# Patient Record
Sex: Male | Born: 1980 | Race: White | Hispanic: No | Marital: Single | Smoking: Never smoker
Health system: Southern US, Community
[De-identification: ages and names within clinical notes are randomized; demographics above are authoritative.]

## PROBLEM LIST (undated history)

## (undated) DIAGNOSIS — H53002 Unspecified amblyopia, left eye: Secondary | ICD-10-CM

---

## 2008-12-04 ENCOUNTER — Emergency Department (HOSPITAL_COMMUNITY): Admission: EM | Admit: 2008-12-04 | Discharge: 2008-12-04 | Payer: Self-pay | Admitting: Emergency Medicine

## 2008-12-30 ENCOUNTER — Encounter: Admission: RE | Admit: 2008-12-30 | Discharge: 2008-12-30 | Payer: Self-pay | Admitting: Specialist

## 2009-02-10 ENCOUNTER — Ambulatory Visit (HOSPITAL_BASED_OUTPATIENT_CLINIC_OR_DEPARTMENT_OTHER): Admission: RE | Admit: 2009-02-10 | Discharge: 2009-02-10 | Payer: Self-pay | Admitting: Orthopedic Surgery

## 2009-02-10 HISTORY — PX: WRIST SURGERY: SHX841

## 2010-02-17 ENCOUNTER — Emergency Department (HOSPITAL_COMMUNITY)
Admission: EM | Admit: 2010-02-17 | Discharge: 2010-02-17 | Payer: Self-pay | Source: Home / Self Care | Admitting: Emergency Medicine

## 2010-02-18 LAB — POCT I-STAT, CHEM 8
Chloride: 97 mEq/L (ref 96–112)
Glucose, Bld: 97 mg/dL (ref 70–99)
HCT: 38 % — ABNORMAL LOW (ref 39.0–52.0)
Hemoglobin: 12.9 g/dL — ABNORMAL LOW (ref 13.0–17.0)
Potassium: 3.8 mEq/L (ref 3.5–5.1)
Sodium: 133 mEq/L — ABNORMAL LOW (ref 135–145)

## 2010-04-13 LAB — POCT HEMOGLOBIN-HEMACUE: Hemoglobin: 15 g/dL (ref 13.0–17.0)
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
## 2011-10-06 ENCOUNTER — Ambulatory Visit: Payer: 59 | Admitting: Family Medicine

## 2011-10-06 VITALS — BP 110/60 | HR 58 | Temp 98.0°F | Resp 16 | Ht 73.0 in | Wt 171.8 lb

## 2011-10-06 DIAGNOSIS — M79643 Pain in unspecified hand: Secondary | ICD-10-CM

## 2011-10-06 DIAGNOSIS — M79609 Pain in unspecified limb: Secondary | ICD-10-CM
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
## 2011-11-08 ENCOUNTER — Encounter (HOSPITAL_BASED_OUTPATIENT_CLINIC_OR_DEPARTMENT_OTHER): Payer: Self-pay | Admitting: *Deleted

## 2011-11-08 ENCOUNTER — Other Ambulatory Visit: Payer: Self-pay | Admitting: Orthopedic Surgery

## 2011-11-09 ENCOUNTER — Encounter (HOSPITAL_BASED_OUTPATIENT_CLINIC_OR_DEPARTMENT_OTHER): Payer: Self-pay | Admitting: Orthopedic Surgery

## 2011-11-09 ENCOUNTER — Encounter (HOSPITAL_BASED_OUTPATIENT_CLINIC_OR_DEPARTMENT_OTHER): Payer: Self-pay | Admitting: Anesthesiology

## 2011-11-09 ENCOUNTER — Encounter (HOSPITAL_BASED_OUTPATIENT_CLINIC_OR_DEPARTMENT_OTHER)
Admission: RE | Disposition: A | Discharge status: Home / Self Care | Payer: Self-pay | Source: Ambulatory Visit | Attending: Orthopedic Surgery

## 2011-11-09 ENCOUNTER — Ambulatory Visit (HOSPITAL_BASED_OUTPATIENT_CLINIC_OR_DEPARTMENT_OTHER)
Admission: RE | Admit: 2011-11-09 | Discharge: 2011-11-09 | Disposition: A | Discharge status: Home / Self Care | Payer: 59 | Source: Ambulatory Visit | Attending: Orthopedic Surgery | Admitting: Orthopedic Surgery

## 2011-11-09 ENCOUNTER — Ambulatory Visit (HOSPITAL_BASED_OUTPATIENT_CLINIC_OR_DEPARTMENT_OTHER): Payer: 59 | Admitting: Anesthesiology

## 2011-11-09 ENCOUNTER — Encounter (HOSPITAL_BASED_OUTPATIENT_CLINIC_OR_DEPARTMENT_OTHER): Payer: Self-pay

## 2011-11-09 DIAGNOSIS — M729 Fibroblastic disorder, unspecified: Secondary | ICD-10-CM | POA: Insufficient documentation

## 2011-11-09 HISTORY — PX: MASS EXCISION: SHX2000

## 2011-11-09 LAB — POCT HEMOGLOBIN-HEMACUE: Hemoglobin: 13.5 g/dL (ref 13.0–17.0)

## 2011-11-09 SURGERY — EXCISION MASS
Anesthesia: General | Site: Hand | Laterality: Left | Wound class: Clean

## 2011-11-09 MED ORDER — FENTANYL CITRATE 0.05 MG/ML IJ SOLN
25.0000 ug | INTRAMUSCULAR | Status: DC | PRN
  Administered 2011-11-09: 50 ug via INTRAVENOUS

## 2011-11-09 MED ORDER — DEXAMETHASONE SODIUM PHOSPHATE 4 MG/ML IJ SOLN
INTRAMUSCULAR | Status: DC | PRN
  Administered 2011-11-09: 10 mg via INTRAVENOUS

## 2011-11-09 MED ORDER — VANCOMYCIN HCL IN DEXTROSE 1-5 GM/200ML-% IV SOLN
1000.0000 mg | INTRAVENOUS | Status: AC
  Administered 2011-11-09: 1000 mg via INTRAVENOUS

## 2011-11-09 MED ORDER — FENTANYL CITRATE 0.05 MG/ML IJ SOLN
INTRAMUSCULAR | Status: DC | PRN
  Administered 2011-11-09 (×2): 50 ug via INTRAVENOUS

## 2011-11-09 MED ORDER — OXYCODONE HCL 5 MG/5ML PO SOLN: 5.0000 mg | Freq: Once | ORAL | Status: DC | PRN

## 2011-11-09 MED ORDER — CHLORHEXIDINE GLUCONATE 4 % EX LIQD: 60.0000 mL | Freq: Once | CUTANEOUS | Status: DC

## 2011-11-09 MED ORDER — PROPOFOL 10 MG/ML IV BOLUS
INTRAVENOUS | Status: DC | PRN
  Administered 2011-11-09: 200 mg via INTRAVENOUS

## 2011-11-09 MED ORDER — MIDAZOLAM HCL 5 MG/5ML IJ SOLN
INTRAMUSCULAR | Status: DC | PRN
  Administered 2011-11-09: 2 mg via INTRAVENOUS

## 2011-11-09 MED ORDER — ONDANSETRON HCL 4 MG/2ML IJ SOLN
INTRAMUSCULAR | Status: DC | PRN
  Administered 2011-11-09: 4 mg via INTRAVENOUS

## 2011-11-09 MED ORDER — BUPIVACAINE HCL (PF) 0.25 % IJ SOLN
INTRAMUSCULAR | Status: DC | PRN
  Administered 2011-11-09: 6 mL

## 2011-11-09 MED ORDER — LACTATED RINGERS IV SOLN
INTRAVENOUS | Status: DC
  Administered 2011-11-09: 12:00:00 via INTRAVENOUS
  Administered 2011-11-09: 10 mL/h via INTRAVENOUS

## 2011-11-09 MED ORDER — OXYCODONE HCL 5 MG PO TABS: 5.0000 mg | ORAL_TABLET | Freq: Once | ORAL | Status: DC | PRN

## 2011-11-09 MED ORDER — ONDANSETRON HCL 4 MG/2ML IJ SOLN: 4.0000 mg | Freq: Four times a day (QID) | INTRAMUSCULAR | Status: DC | PRN

## 2011-11-09 MED ORDER — HYDROCODONE-ACETAMINOPHEN 5-500 MG PO TABS: 1.0000 | ORAL_TABLET | ORAL | Status: AC | PRN

## 2011-11-09 MED ORDER — LIDOCAINE HCL (CARDIAC) 20 MG/ML IV SOLN
INTRAVENOUS | Status: DC | PRN
  Administered 2011-11-09: 50 mg via INTRAVENOUS

## 2011-11-09 SURGICAL SUPPLY — 51 items
BANDAGE COBAN STERILE 2 (GAUZE/BANDAGES/DRESSINGS) IMPLANT
BANDAGE GAUZE ELAST BULKY 4 IN (GAUZE/BANDAGES/DRESSINGS) ×2 IMPLANT
BLADE MINI RND TIP GREEN BEAV (BLADE) IMPLANT
BLADE SURG 15 STRL LF DISP TIS (BLADE) ×1 IMPLANT
BLADE SURG 15 STRL SS (BLADE) ×1
BNDG COHESIVE 1X5 TAN STRL LF (GAUZE/BANDAGES/DRESSINGS) IMPLANT
BNDG COHESIVE 3X5 TAN STRL LF (GAUZE/BANDAGES/DRESSINGS) ×2 IMPLANT
BNDG ESMARK 4X9 LF (GAUZE/BANDAGES/DRESSINGS) ×2 IMPLANT
CHLORAPREP W/TINT 26ML (MISCELLANEOUS) ×2 IMPLANT
CLOTH BEACON ORANGE TIMEOUT ST (SAFETY) ×2 IMPLANT
CORDS BIPOLAR (ELECTRODE) ×2 IMPLANT
COVER MAYO STAND STRL (DRAPES) ×2 IMPLANT
COVER TABLE BACK 60X90 (DRAPES) ×2 IMPLANT
CUFF TOURNIQUET SINGLE 18IN (TOURNIQUET CUFF) IMPLANT
DECANTER SPIKE VIAL GLASS SM (MISCELLANEOUS) IMPLANT
DRAIN PENROSE 1/2X12 LTX STRL (WOUND CARE) IMPLANT
DRAPE EXTREMITY T 121X128X90 (DRAPE) ×2 IMPLANT
DRAPE SURG 17X23 STRL (DRAPES) ×2 IMPLANT
GAUZE XEROFORM 1X8 LF (GAUZE/BANDAGES/DRESSINGS) ×2 IMPLANT
GLOVE BIO SURGEON STRL SZ 6.5 (GLOVE) ×2 IMPLANT
GLOVE BIO SURGEON STRL SZ7.5 (GLOVE) ×4 IMPLANT
GLOVE BIOGEL PI IND STRL 8 (GLOVE) ×2 IMPLANT
GLOVE BIOGEL PI IND STRL 8.5 (GLOVE) ×1 IMPLANT
GLOVE BIOGEL PI INDICATOR 8 (GLOVE) ×2
GLOVE BIOGEL PI INDICATOR 8.5 (GLOVE) ×1
GLOVE SURG ORTHO 8.0 STRL STRW (GLOVE) ×2 IMPLANT
GOWN BRE IMP PREV XXLGXLNG (GOWN DISPOSABLE) ×6 IMPLANT
GOWN PREVENTION PLUS XLARGE (GOWN DISPOSABLE) IMPLANT
NDL SAFETY ECLIPSE 18X1.5 (NEEDLE) ×1 IMPLANT
NEEDLE 27GAX1X1/2 (NEEDLE) IMPLANT
NEEDLE HYPO 18GX1.5 SHARP (NEEDLE) ×1
NS IRRIG 1000ML POUR BTL (IV SOLUTION) ×2 IMPLANT
PACK BASIN DAY SURGERY FS (CUSTOM PROCEDURE TRAY) ×2 IMPLANT
PAD CAST 3X4 CTTN HI CHSV (CAST SUPPLIES) IMPLANT
PADDING CAST ABS 3INX4YD NS (CAST SUPPLIES)
PADDING CAST ABS 4INX4YD NS (CAST SUPPLIES) ×1
PADDING CAST ABS COTTON 3X4 (CAST SUPPLIES) IMPLANT
PADDING CAST ABS COTTON 4X4 ST (CAST SUPPLIES) ×1 IMPLANT
PADDING CAST COTTON 3X4 STRL (CAST SUPPLIES)
SPLINT PLASTER CAST XFAST 3X15 (CAST SUPPLIES) IMPLANT
SPLINT PLASTER XTRA FASTSET 3X (CAST SUPPLIES)
SPONGE GAUZE 4X4 12PLY (GAUZE/BANDAGES/DRESSINGS) ×2 IMPLANT
STOCKINETTE 4X48 STRL (DRAPES) ×2 IMPLANT
SUT VIC AB 4-0 P2 18 (SUTURE) IMPLANT
SUT VICRYL RAPID 5 0 P 3 (SUTURE) IMPLANT
SUT VICRYL RAPIDE 4/0 PS 2 (SUTURE) ×2 IMPLANT
SYR BULB 3OZ (MISCELLANEOUS) ×2 IMPLANT
SYR CONTROL 10ML LL (SYRINGE) IMPLANT
TOWEL OR 17X24 6PK STRL BLUE (TOWEL DISPOSABLE) ×4 IMPLANT
UNDERPAD 30X30 INCONTINENT (UNDERPADS AND DIAPERS) ×2 IMPLANT
WATER STERILE IRR 1000ML POUR (IV SOLUTION) ×2 IMPLANT

## 2011-11-09 NOTE — Anesthesia Procedure Notes (Signed)
Procedure Name: LMA Insertion Date/Time: 11/09/2011 12:25 PM Performed by: Gar Gibbon Pre-anesthesia Checklist: Patient identified, Emergency Drugs available, Suction available and Patient being monitored Patient Re-evaluated:Patient Re-evaluated prior to inductionOxygen Delivery Method: Circle System Utilized Preoxygenation: Pre-oxygenation with 100% oxygen Intubation Type: IV induction Ventilation: Mask ventilation without difficulty LMA: LMA inserted LMA Size: 5.0 Number of attempts: 1 Airway Equipment and Method: bite block Placement Confirmation: positive ETCO2 Tube secured with: Tape Dental Injury: Teeth and Oropharynx as per pre-operative assessment

## 2011-11-09 NOTE — H&P (Signed)
  Brian Wong is 31 yo RH dominate male with a mass on his left palm. He has no history of trauma.  MRI reveals a venous mass.He states it gets bigger and smaller. It is tender to palpation.  He desires removal. Allergies: Pen Medications: none Family History: High blood pressure, arthritis Social History: Smoke: neg  ETOH: social ROS: Mass, otherwise Neg Brian Wong is an 31 y.o. male.   Chief Complaint: mass left palm HPI: see above  History reviewed. No pertinent past medical history.  Past Surgical History  Procedure Date  . Wrist surgery 02/10/2009    exc. of hamate hook right wrist    History reviewed. No pertinent family history. Social History:  reports that he has never smoked. He does not have any smokeless tobacco history on file. His alcohol and drug histories not on file.  Allergies:  Allergies  Allergen Reactions  . Penicillins     No prescriptions prior to admission    No results found for this or any previous visit (from the past 48 hour(s)).  No results found.   Pertinent items are noted in HPI.  Blood pressure 114/69, pulse 56, temperature 98.2 F (36.8 C), temperature source Oral, resp. rate 20, height 6\' 2"  (1.88 m), weight 77.111 kg (170 lb), SpO2 99.00%.  General appearance: alert, cooperative and appears stated age Head: Normocephalic, without obvious abnormality Neck: no adenopathy Resp: clear to auscultation bilaterally Cardio: regular rate and rhythm, S1, S2 normal, no murmur, click, rub or gallop GI: soft, non-tender; bowel sounds normal; no masses,  no organomegaly Extremities: mass left palm Pulses: 2+ and symmetric Skin: Skin color, texture, turgor normal. No rashes or lesions Neurologic: Grossly normal Incision/Wound: na  Assessment/Plan Mass left palm. 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

## 2011-11-09 NOTE — Op Note (Signed)
Dictation number: [id number]

## 2011-11-09 NOTE — Anesthesia Preprocedure Evaluation (Signed)
Anesthesia Evaluation  Patient identified by MRN, date of birth, ID band Patient awake    Reviewed: Allergy & Precautions, H&P , NPO status , Patient's Chart, lab work & pertinent test results  Airway Mallampati: II  Neck ROM: full    Dental   Pulmonary          Cardiovascular     Neuro/Psych    GI/Hepatic   Endo/Other    Renal/GU      Musculoskeletal   Abdominal   Peds  Hematology   Anesthesia Other Findings   Reproductive/Obstetrics                          Anesthesia Physical Anesthesia Plan  ASA: I  Anesthesia Plan: General   Post-op Pain Management:    Induction: Intravenous  Airway Management Planned: LMA  Additional Equipment:   Intra-op Plan:   Post-operative Plan:   Informed Consent: I have reviewed the patients History and Physical, chart, labs and discussed the procedure including the risks, benefits and alternatives for the proposed anesthesia with the patient or authorized representative who has indicated his/her understanding and acceptance.     Plan Discussed with: CRNA and Surgeon  Anesthesia Plan Comments:        Anesthesia Quick Evaluation  

## 2011-11-09 NOTE — Transfer of Care (Signed)
Immediate Anesthesia Transfer of Care Note  Patient: Brian Wong  Procedure(s) Performed: Procedure(s) (LRB) with comments: EXCISION MASS (Left) - Excision Mass Left Palm  Patient Location: PACU  Anesthesia Type: General  Level of Consciousness: sedated  Airway & Oxygen Therapy: Patient Spontanous Breathing and Patient connected to face mask oxygen  Post-op Assessment: Report given to PACU RN and Post -op Vital signs reviewed and stable  Post vital signs: Reviewed and stable  Complications: No apparent anesthesia complications

## 2011-11-09 NOTE — Brief Op Note (Signed)
11/09/2011  12:46 PM  PATIENT:  Brian Wong  31 y.o. male  PRE-OPERATIVE DIAGNOSIS:  Mass Left Palm  POST-OPERATIVE DIAGNOSIS:  Mass Left Palm  PROCEDURE:  Procedure(s) (LRB) with comments: EXCISION MASS (Left) - Excision Mass Left Palm  SURGEON:  Surgeon(s) and Role:    * Nicki Reaper, MD - Primary    * Tami Ribas, MD - Assisting  PHYSICIAN ASSISTANT:   ASSISTANTS: Karlyn Agee, MD   ANESTHESIA:   local and general  EBL:     BLOOD ADMINISTERED:none  DRAINS: none   LOCAL MEDICATIONS USED:  MARCAINE     SPECIMEN:  Excision  DISPOSITION OF SPECIMEN:  PATHOLOGY  COUNTS:  YES  TOURNIQUET:   Total Tourniquet Time Documented: Forearm (Left) - 12 minutes  DICTATION: .Other Dictation: Dictation Number [id number]  PLAN OF CARE: Discharge to home after PACU  PATIENT DISPOSITION:  PACU - hemodynamically stable.

## 2011-11-09 NOTE — Anesthesia Postprocedure Evaluation (Signed)
Anesthesia Post Note  Patient: Brian Wong  Procedure(s) Performed: Procedure(s) (LRB): EXCISION MASS (Left)  Anesthesia type: General  Patient location: PACU  Post pain: Pain level controlled and Adequate analgesia  Post assessment: Post-op Vital signs reviewed, Patient's Cardiovascular Status Stable, Respiratory Function Stable, Patent Airway and Pain level controlled  Last Vitals:  Filed Vitals:   11/09/11 1330  BP: 116/71  Pulse: 57  Temp:   Resp: 24    Post vital signs: Reviewed and stable  Level of consciousness: awake, alert  and oriented  Complications: No apparent anesthesia complications

## 2011-11-10 NOTE — Op Note (Signed)
NAMELORENZO, ARSCOTT              ACCOUNT NO.:  [id number]  MEDICAL RECORD NO.:  [id number]  LOCATION:                                 FACILITY:  PHYSICIAN:  Cindee Salt, M.D.            DATE OF BIRTH:  DATE OF PROCEDURE:  11/09/2011 DATE OF DISCHARGE:                              OPERATIVE REPORT   PREOPERATIVE DIAGNOSIS:  Mass, left palm.  POSTOPERATIVE DIAGNOSIS:  Mass, left palm.  OPERATION:  Excisional biopsy of mass, left palm.  SURGEON:  Cindee Salt, M.D.  ASSISTANT:  Betha Loa, MD  ANESTHESIA:  General with local infiltration.  ANESTHESIOLOGIST:  Achille Rich, MD.  HISTORY:  The patient is a 31 year old male with a history of a mass on the palm left hand at the metacarpophalangeal joint crease of the thenar eminence of his left little finger.  He is desirous of having this removed.  MRI reveals no discrete mass, but area of vascular proliferation.  He is complaining of discomfort.  He is aware of risks and complications including infection, recurrence of injury to arteries, nerves, tendons, incomplete relief of symptoms, dystrophy.  In the preoperative area, the patient is seen, the extremity marked by both the patient and surgeon.  Antibiotic was given.  PROCEDURE:  The patient was brought to the operating room where a general anesthetic was carried out without difficulty.  He was prepped using ChloraPrep, supine position, left arm free.  A 3-minute dry time was allowed.  Time-out taken, confirming the patient and procedure.  A general anesthetic was carried out.  The limb was exsanguinated with an Esmarch bandage.  Tourniquet placed high on the arm was inflated to 250 mmHg.  A longitudinal incision was made directly over the mass, carried down through subcutaneous tissue.  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  With blunt and sharp dissection, this was dissected free along with a portion of the palmar fascia proximally and distally.   The specimen was sent to Pathology.  No further lesions were identified. Neurovascular bundles were intact deep to the area of the mass.  A local infiltration with 0.25% Marcaine was given after closure of the wound with 4-0 Vicryl Rapide sutures and after adequate irrigation, a sterile compressive dressing was applied.  On deflation of the tourniquet, all fingers immediately pinked.  He was taken to the recovery room for observation in a satisfactory condition.  He will be discharged home to return to the [facility] in 1 week on Vicodin.          ______________________________ Cindee Salt, M.D.     GK/MEDQ  D:  11/09/2011  T:  11/10/2011  Job:  [id number]

## 2011-11-11 ENCOUNTER — Encounter (HOSPITAL_BASED_OUTPATIENT_CLINIC_OR_DEPARTMENT_OTHER): Payer: Self-pay | Admitting: Orthopedic Surgery

## 2012-06-25 IMAGING — CR DG CHEST 2V
2 series · 2 of 2 positions shown · non-contrast
Comparison: None

CLINICAL DATA: Dizziness, cough.Fever.

CHEST - 2 VIEW

[w chest pa]
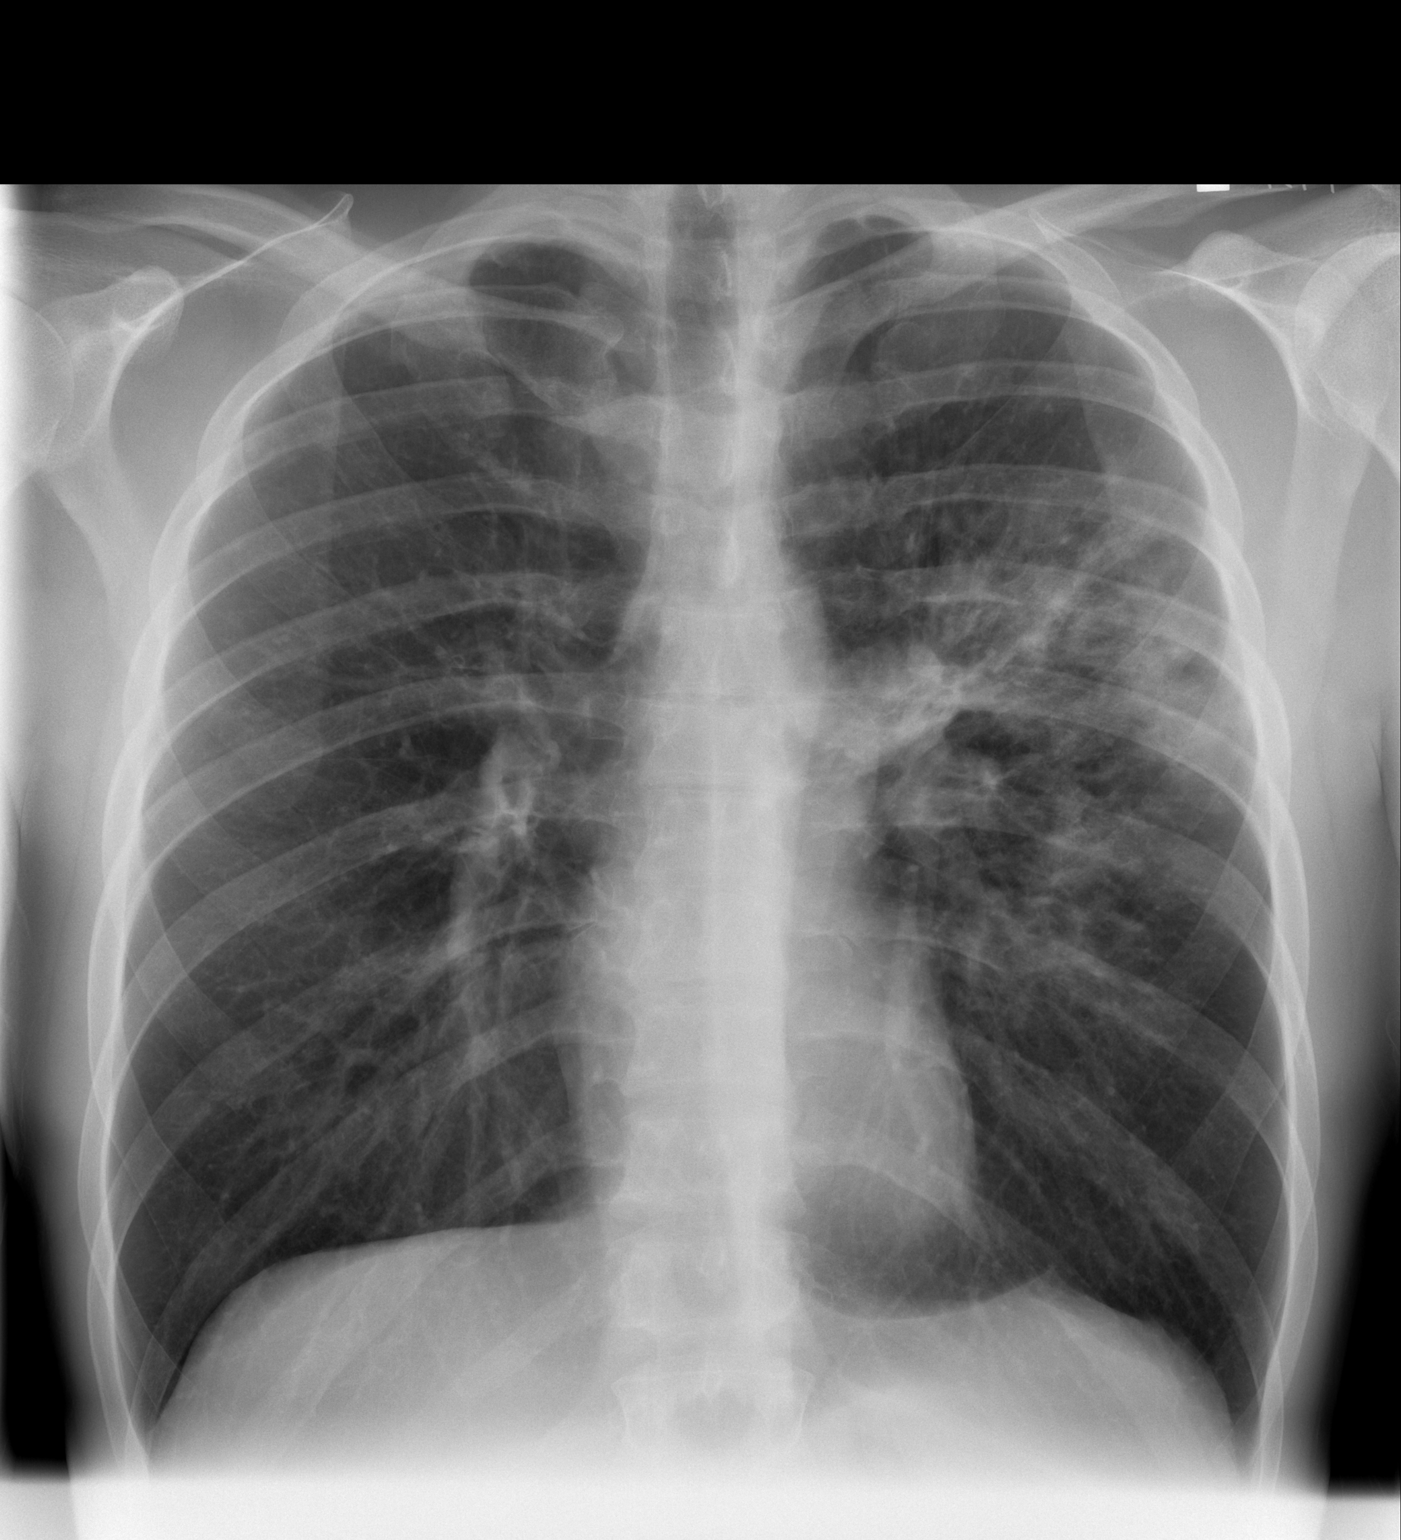

[w chest lat]
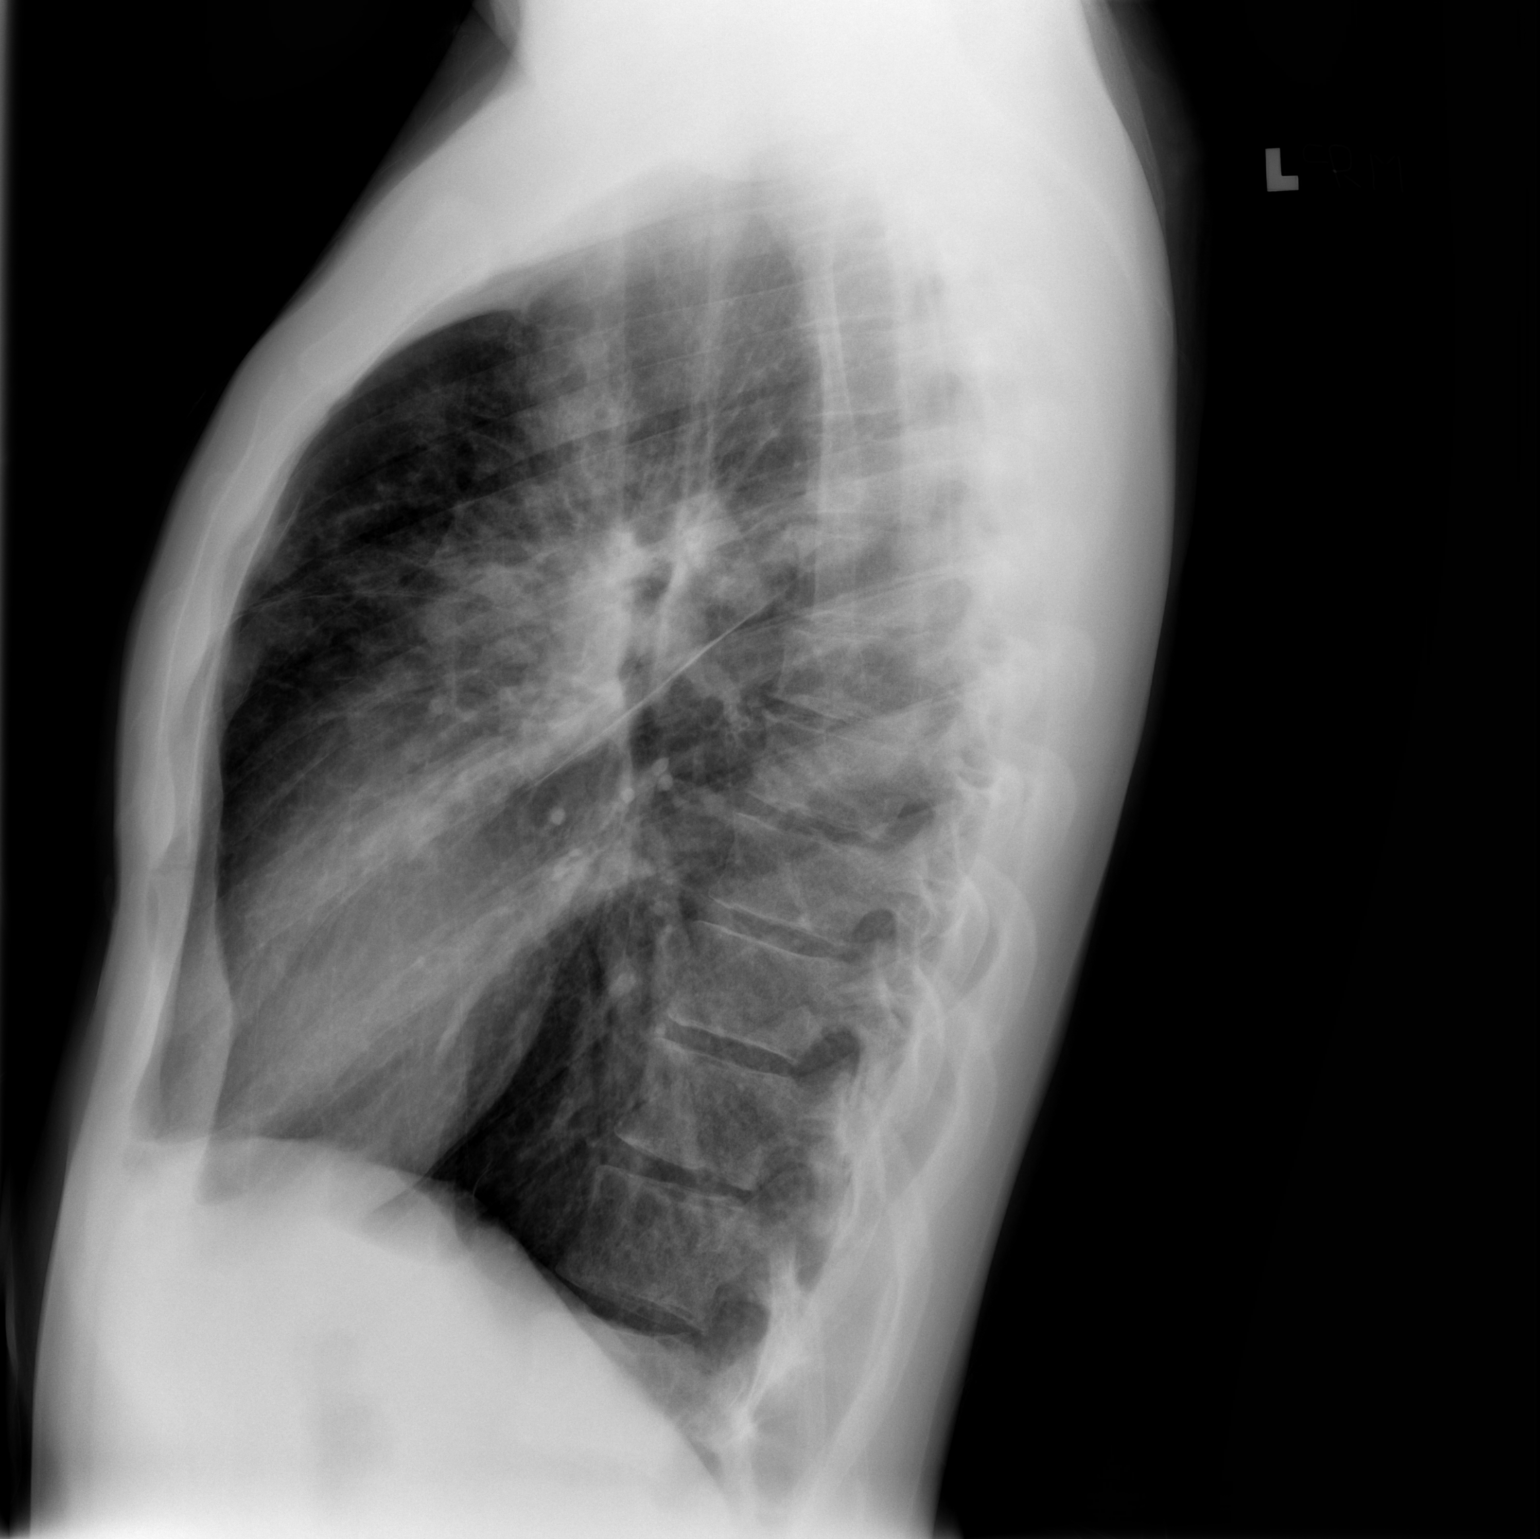

[2 of 2 positions shown; findings below may reference images not displayed]

FINDINGS: Consolidation is noted in the left upper lobe compatible
with pneumonia.  No confluent opacity on the right.  Heart is
normal size.  No effusions or acute bony abnormality.
IMPRESSION: Left upper lobe pneumonia.

## 2016-04-25 ENCOUNTER — Ambulatory Visit
Admission: EM | Admit: 2016-04-25 | Discharge: 2016-04-25 | Disposition: A | Discharge status: Home / Self Care | Payer: BLUE CROSS/BLUE SHIELD | Attending: Family Medicine | Admitting: Family Medicine

## 2016-04-25 ENCOUNTER — Encounter: Payer: Self-pay | Admitting: Gynecology

## 2016-04-25 DIAGNOSIS — H10021 Other mucopurulent conjunctivitis, right eye: Secondary | ICD-10-CM

## 2016-04-25 DIAGNOSIS — L03213 Periorbital cellulitis: Secondary | ICD-10-CM | POA: Diagnosis not present

## 2016-04-25 HISTORY — DX: Unspecified amblyopia, left eye: H53.002

## 2016-04-25 MED ORDER — SULFAMETHOXAZOLE-TRIMETHOPRIM 800-160 MG PO TABS: 1.0000 | ORAL_TABLET | Freq: Two times a day (BID) | ORAL | 0 refills | Status: DC

## 2016-04-25 MED ORDER — MOXIFLOXACIN HCL 0.5 % OP SOLN: 1.0000 [drp] | Freq: Three times a day (TID) | OPHTHALMIC | 0 refills | Status: DC

## 2016-04-25 NOTE — ED Provider Notes (Signed)
[facility]    CSN: [id number] Arrival date & time: 04/25/16  1440     History   Chief Complaint Chief Complaint  Patient presents with  . Eye Problem    HPI Brian Wong is a 36 y.o. male.   The history is provided by the patient.  Eye Problem  Location:  Right eye Quality:  Aching (eyelid swelling, draining pus, and eye redness for 3 days) Severity:  Moderate Onset quality:  Sudden Duration:  3 days Timing:  Constant Progression:  Worsening Chronicity:  New Context: not burn, not chemical exposure, not contact lens problem, not direct trauma, not foreign body, not using machinery, not scratch, not smoke exposure and not UV exposure   Relieved by:  None tried Worsened by:  Nothing Associated symptoms: discharge, itching, redness and swelling   Associated symptoms: no blurred vision, no crusting, no decreased vision, no double vision, no facial rash, no inflammation, no nausea, no numbness, no photophobia, no scotomas, no tearing, no tingling and no vomiting   Risk factors: no conjunctival hemorrhage, no exposure to pinkeye, no previous injury to eye, no recent herpes zoster and no recent URI     Past Medical History:  Diagnosis Date  . 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   Past Surgical History:  Procedure Laterality Date  . MASS EXCISION  11/09/2011   Procedure: EXCISION MASS;  Surgeon: Nicki Reaper, MD;  Location: [facility];  Service: Orthopedics;  Laterality: Left;  Excision Mass Left Palm  . WRIST SURGERY  02/10/2009   exc. of hamate hook right wrist       Home Medications    Prior to Admission medications   Medication Sig Start Date End Date Taking? Authorizing Provider  moxifloxacin (VIGAMOX) 0.5 % ophthalmic solution Place 1 drop into the right eye 3 (three) times daily. 04/25/16   Payton Mccallum, MD  sulfamethoxazole-trimethoprim (BACTRIM DS,SEPTRA DS) 800-160 MG tablet  Take 1 tablet by mouth 2 (two) times daily. 04/25/16   Payton Mccallum, MD    Family History No family history on file.  Social History Social History  Substance Use Topics  . Smoking status: Never Smoker  . Smokeless tobacco: Never Used  . Alcohol use No     Allergies   Penicillins   Review of Systems Review of Systems  Eyes: Positive for discharge, redness and itching. Negative for blurred vision, double vision and photophobia.  Gastrointestinal: Negative for nausea and vomiting.  Neurological: Negative for tingling and numbness.     Physical Exam Triage Vital Signs ED Triage Vitals  Enc Vitals Group     BP 04/25/16 1450 111/73     Pulse Rate 04/25/16 1450 67     Resp 04/25/16 1450 16     Temp 04/25/16 1450 98.2 F (36.8 C)     Temp Source 04/25/16 1450 Oral     SpO2 04/25/16 1450 98 %     Weight 04/25/16 1448 215 lb (97.5 kg)     Height 04/25/16 1448 6' (1.829 m)     Head Circumference --      Peak Flow --      Pain Score 04/25/16 1449 4     Pain Loc --      Pain Edu? --      Excl. in GC? --    No data found.   Updated Vital Signs BP 111/73 (BP Location:  Left Arm)   Pulse 67   Temp 98.2 F (36.8 C) (Oral)   Resp 16   Ht 6' (1.829 m)   Wt 215 lb (97.5 kg)   SpO2 98%   BMI 29.16 kg/m   Visual Acuity Right Eye Distance: 20/25 (corrective) Left Eye Distance: 20/70 (lazy eye as a child with corrective lens) Bilateral Distance: 20/25 (with corrective lens)  Right Eye Near:   Left Eye Near:    Bilateral Near:     Physical Exam  Constitutional: He appears well-developed and well-nourished. No distress.  Eyes: EOM are normal. Pupils are equal, round, and reactive to light. Right eye exhibits discharge and exudate. Right conjunctiva is injected. No scleral icterus.  Right upper and lower eyelid edema, blanchable erythema and tenderness to palpation; no foreign bodies  Skin: He is not diaphoretic.  Nursing note and vitals reviewed.    UC Treatments  / Results  Labs (all labs ordered are listed, but only abnormal results are displayed) Labs Reviewed - No data to display  EKG  EKG Interpretation None       Radiology No results found.  Procedures Procedures (including critical care time)  Medications Ordered in UC Medications - No data to display   Initial Impression / Assessment and Plan / UC Course  I have reviewed the triage vital signs and the nursing notes.  Pertinent labs & imaging results that were available during my care of the patient were reviewed by me and considered in my medical decision making (see chart for details).       Final Clinical Impressions(s) / UC Diagnoses   Final diagnoses:  Other mucopurulent conjunctivitis of right eye  Preseptal cellulitis of right eye    New Prescriptions New Prescriptions   MOXIFLOXACIN (VIGAMOX) 0.5 % OPHTHALMIC SOLUTION    Place 1 drop into the right eye 3 (three) times daily.   SULFAMETHOXAZOLE-TRIMETHOPRIM (BACTRIM DS,SEPTRA DS) 800-160 MG TABLET    Take 1 tablet by mouth 2 (two) times daily.    1. diagnosis reviewed with patient 2. rx as per orders above; reviewed possible side effects, interactions, risks and benefits  3. Recommend supportive treatment with warm compresses to skin area, otc analgesics prn 4. Follow-up prn if symptoms worsen or don't improve   Payton Mccallum, MD 04/25/16 [phone]

## 2016-04-25 NOTE — Discharge Instructions (Signed)
Warm compresses to skin area 

## 2016-04-25 NOTE — ED Triage Notes (Signed)
Patient  patient right eye irritation /redness / drainage x 1 week. Patient wear corrective lens.

## 2016-11-05 ENCOUNTER — Ambulatory Visit
Admission: EM | Admit: 2016-11-05 | Discharge: 2016-11-05 | Disposition: A | Discharge status: Home / Self Care | Payer: BLUE CROSS/BLUE SHIELD | Attending: Family Medicine | Admitting: Family Medicine

## 2016-11-05 ENCOUNTER — Encounter: Payer: Self-pay | Admitting: Emergency Medicine

## 2016-11-05 DIAGNOSIS — L089 Local infection of the skin and subcutaneous tissue, unspecified: Secondary | ICD-10-CM | POA: Diagnosis not present

## 2016-11-05 DIAGNOSIS — L03012 Cellulitis of left finger: Secondary | ICD-10-CM

## 2016-11-05 DIAGNOSIS — M79645 Pain in left finger(s): Secondary | ICD-10-CM | POA: Diagnosis not present

## 2016-11-05 MED ORDER — SULFAMETHOXAZOLE-TRIMETHOPRIM 800-160 MG PO TABS: 1.0000 | ORAL_TABLET | Freq: Two times a day (BID) | ORAL | 0 refills | Status: AC

## 2016-11-05 MED ORDER — MUPIROCIN 2 % EX OINT
TOPICAL_OINTMENT | CUTANEOUS | 0 refills | Status: DC
Start: 2016-11-05 — End: 2016-11-28

## 2016-11-05 MED ORDER — TETANUS-DIPHTH-ACELL PERTUSSIS 5-2.5-18.5 LF-MCG/0.5 IM SUSP: 0.5000 mL | Freq: Once | INTRAMUSCULAR | Status: DC

## 2016-11-05 NOTE — Discharge Instructions (Signed)
Take medication as prescribed. Keep clean. Frequent warm soapy water soaks.   Follow up with your primary care physician this week as needed. Return to Urgent care for new or worsening concerns.
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
## 2016-11-05 NOTE — ED Triage Notes (Signed)
Patient in today c/o left ring finger pain and infection x 1 week. Now patient has streaks going up his forearm.

## 2016-11-28 ENCOUNTER — Ambulatory Visit
Admission: EM | Admit: 2016-11-28 | Discharge: 2016-11-28 | Disposition: A | Discharge status: Home / Self Care | Payer: BLUE CROSS/BLUE SHIELD | Attending: Family Medicine | Admitting: Family Medicine

## 2016-11-28 DIAGNOSIS — J020 Streptococcal pharyngitis: Secondary | ICD-10-CM | POA: Diagnosis not present

## 2016-11-28 DIAGNOSIS — H9202 Otalgia, left ear: Secondary | ICD-10-CM

## 2016-11-28 DIAGNOSIS — G501 Atypical facial pain: Secondary | ICD-10-CM | POA: Diagnosis not present

## 2016-11-28 LAB — RAPID STREP SCREEN (MED CTR MEBANE ONLY): Streptococcus, Group A Screen (Direct): POSITIVE — AB

## 2016-11-28 MED ORDER — AMOXICILLIN-POT CLAVULANATE 875-125 MG PO TABS: 1.0000 | ORAL_TABLET | Freq: Two times a day (BID) | ORAL | 0 refills | Status: DC
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
## 2017-05-02 ENCOUNTER — Ambulatory Visit (INDEPENDENT_AMBULATORY_CARE_PROVIDER_SITE_OTHER): Payer: BLUE CROSS/BLUE SHIELD | Admitting: Urology

## 2017-05-02 ENCOUNTER — Encounter: Payer: Self-pay | Admitting: Urology

## 2017-05-02 VITALS — BP 116/74 | HR 76 | Resp 16 | Ht 73.0 in | Wt 219.0 lb

## 2017-05-02 DIAGNOSIS — Z3009 Encounter for other general counseling and advice on contraception: Secondary | ICD-10-CM

## 2017-05-02 MED ORDER — DIAZEPAM 10 MG PO TABS: 10.0000 mg | ORAL_TABLET | Freq: Once | ORAL | 0 refills | Status: AC

## 2017-05-02 NOTE — Progress Notes (Signed)
05/02/2017 3:49 PM   Brian Wong 03/28/1980 [id number]  Referring provider: Lazarus Gowda, MD [address] [place], [place] [address]  No chief complaint on file.   HPI: 37 y.o. year old male referred for further evaluation of possible vasectomy.  Denies a history of testicular trauma or pain.  No urinary issues.  No previous scrotal surgeries.  He is married with two biological children, one born just 2 weeks ago.  He and his wife are in agreement that they desire no further biological pregnancies.   PMH: Past Medical History:  Diagnosis Date  . 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 MASS EXCISION  11/09/2011   Procedure: EXCISION MASS;  Surgeon: Nicki Reaper, MD;  Location: [facility];  Service: Orthopedics;  Laterality: Left;  Excision Mass Left Palm  . WRIST SURGERY  02/10/2009   exc. of hamate hook right wrist    Home Medications:  Allergies as of 05/02/2017      Reactions   Augmentin [amoxicillin-pot Clavulanate]    Pt states he had an extreme cardiac reaction   Penicillins       Medication List        Accurate as of 05/02/17  3:49 PM. Always use your most recent med list.          diazepam 10 MG tablet Commonly known as:  VALIUM Take 1 tablet (10 mg total) by mouth once for 1 dose. Take 1 hour prior to procedure. Please have a driver available.   fluticasone 50 MCG/ACT nasal spray Commonly known as:  FLONASE Place into both nostrils daily as needed for allergies or rhinitis.   ipratropium 0.03 % nasal spray Commonly known as:  ATROVENT Place into the nose.   MULTI-VITAMINS Tabs Take by mouth.   PROVENTIL HFA 108 (90 Base) MCG/ACT inhaler Generic drug:  albuterol Inhale into the lungs.   valACYclovir 500 MG tablet Commonly known as:  VALTREX Take by mouth.       Allergies:  Allergies  Allergen Reactions  . Augmentin [Amoxicillin-Pot Clavulanate]    Pt states he had an extreme cardiac reaction  . Penicillins     Family History: Family History  Problem Relation Age of Onset  . Hypertension Mother   . Depression Mother   . Rheum arthritis Father   . Hypertension Father   . Prostate cancer Neg Hx   . Kidney cancer Neg Hx   . Bladder Cancer Neg Hx     Social History:  reports that he has never smoked. He has never used smokeless tobacco. He reports that he does not drink alcohol or use drugs.  ROS: UROLOGY Frequent Urination?: No Hard to postpone urination?: No Burning/pain with urination?: No Get up at night to urinate?: No Leakage of urine?: No Urine stream starts and stops?: No Trouble starting stream?: No Do you have to strain to urinate?: No Blood in urine?: No Urinary tract infection?: No Sexually transmitted disease?: No Injury to kidneys or bladder?: No Painful intercourse?: No Weak stream?: No Erection problems?: No Penile pain?: No  Gastrointestinal Nausea?: No Vomiting?: No Indigestion/heartburn?: No Diarrhea?: No Constipation?: No  Constitutional Fever: No Night sweats?: No Weight loss?: No Fatigue?: No  Skin Skin rash/lesions?: No Itching?: No  Eyes Blurred vision?: No Double vision?: No  Ears/Nose/Throat Sore throat?: No Sinus problems?: No  Hematologic/Lymphatic Swollen glands?: No Easy bruising?: No  Cardiovascular Leg  swelling?: No Chest pain?: No  Respiratory Cough?: No Shortness of breath?: No  Endocrine Excessive thirst?: No  Musculoskeletal Back pain?: No Joint pain?: No  Neurological Headaches?: No Dizziness?: No  Psychologic Depression?: No Anxiety?: No  Physical Exam: BP 116/74   Pulse 76   Resp 16   Ht 6\' 1"  (1.854 m)   Wt 219 lb (99.3 kg)   SpO2 98%   BMI 28.89 kg/m   Constitutional:  Alert and oriented, No acute distress. HEENT: [place] AT, moist mucus membranes.  Trachea midline, no masses. Cardiovascular: No clubbing, cyanosis, or  edema. Respiratory: Normal respiratory effort, no increased work of breathing. GI: Abdomen is soft, nontender, nondistended, no abdominal masses GU: Normal phallus.  Bilateral descended testicles without masses.  Vasa easily palpable bilaterally. Skin: No rashes, bruises or suspicious lesions. Neurologic: Grossly intact, no focal deficits, moving all 4 extremities. Psychiatric: Normal mood and affect.  Laboratory Data: N/a  Urinalysis n/a  Pertinent Imaging: N/a  Assessment & Plan:    1. Vasectomy evaluation Today, we discussed what the vas deferens is, where it is located, and its function. We reviewed the procedure for vasectomy, it's risks, benefits, alternatives, and likelihood of achieving his goals. We discussed in detail the procedure, complications, and recovery as well as the need for clearance prior to unprotected intercourse. We discussed that vasectomy does not protect against sexually transmitted diseases. We discussed that this procedure does not result in immediate sterility and that they would need to use other forms of birth control until he has been cleared with negative postvasectomy semen analyses. I explained that the procedure is considered to be permanent and that attempts at reversal have varying degrees of success. These options include vasectomy reversal, sperm retrieval, and in vitro fertilization; these can be very expensive. We discussed the chance of postvasectomy pain syndrome which occurs in less than 5% of patients. I explained to the patient that there is no treatment to resolve this chronic pain, and that if it developed I would not be able to help resolve the issue, but that surgery is generally not needed for correction. I explained there have even been reports of systemic like illness associated with this chronic pain, and that there was no good cure. I explained that vasectomy it is not a 100% reliable form of birth control, and the risk of pregnancy after  vasectomy is approximately 1 in 2000 men who had a negative postvasectomy semen analysis or rare non-motile sperm. I explained that repeat vasectomy was necessary in less than 1% of vasectomy procedures when employing the type of technique that I use. I explained that he should refrain from ejaculation for approximately one week following vasectomy. I explained that there are other options for birth control which are permanent and non-permanent; we discussed these. I explained the rates of surgical complications, such as symptomatic hematoma or infection, are low (1-2%) and vary with the surgeon's experience and criteria used to diagnose the complication.   The patient had the opportunity to ask questions to his stated satisfaction. He voiced understanding of the above factors and stated that he has read all the information provided to him and the packets and informed consent.  He is interested in receiving of Valium 10 mg prior to the procedure for the purpose of anxiolysis.  A prescription was given today.  He will have a driver on the day of the procedure.   Schedule vasectomy  Vanna ScotlandAshley Louisa Favaro, MD  [facility] [address]  [address], [place], [place] [address] [phone]

## 2017-06-10 ENCOUNTER — Encounter: Payer: Self-pay | Admitting: Urology

## 2017-06-10 ENCOUNTER — Ambulatory Visit (INDEPENDENT_AMBULATORY_CARE_PROVIDER_SITE_OTHER): Payer: BLUE CROSS/BLUE SHIELD | Admitting: Urology

## 2017-06-10 VITALS — BP 115/65 | HR 84

## 2017-06-10 DIAGNOSIS — Z302 Encounter for sterilization: Secondary | ICD-10-CM

## 2017-06-10 NOTE — Progress Notes (Signed)
06/10/17  CC:  Chief Complaint  Patient presents with  . VAS    HPI: 37 year old male with 2 biological children who desires pregnancies.  Patient previously discussed in detail.  All additional questions were answered today.  Blood pressure 115/65, pulse 84. NED. A&Ox3.   No respiratory distress   Abd soft, NT, ND Normal external genitalia with patent urethral meatus   Bilateral Vasectomy Procedure  Pre-Procedure: - Patient's scrotum was prepped and draped for vasectomy. - The vas was palpated through the scrotal skin on the left. - 1% Xylocaine was injected into the skin and surrounding tissue for placement  - In a similar manner, the vas on the right was identified, anesthetized, and stabilized.  Procedure: -11 blade is used to make a small stab incision overlying the vas - The left vas was isolated and brought up through the incision exposing that structure. - Bleeding points were cauterized as they occurred. - The vas was free from the surrounding structures and brought to the view. - A segment was positioned for placement with a hemostat. - A second hemostat was placed and a small segment between the two hemostats and was removed for inspection. - Each end of the transected vas lumen was fulgurated/ obliterated using needlepoint electrocautery -A fascial interposition was performed on testicular end of the vas using #3-0 chromic suture -The same procedure was performed on the right. - A single suture of #3-0 chromic catgut was used to close each lateral scrotal skin incision - A dressing was applied.  Post-Procedure: - Patient was instructed in care of the operative area - A specimen is to be delivered in 12 weeks   -Another form of contraception is to be used until post vasectomy semen analysis  Brian Scotland, MD

## 2017-09-09 ENCOUNTER — Other Ambulatory Visit: Payer: Self-pay | Admitting: Family Medicine

## 2017-09-09 DIAGNOSIS — Z9852 Vasectomy status: Secondary | ICD-10-CM

## 2017-09-12 ENCOUNTER — Other Ambulatory Visit: Payer: BLUE CROSS/BLUE SHIELD

## 2017-09-12 DIAGNOSIS — Z9852 Vasectomy status: Secondary | ICD-10-CM

## 2017-09-13 LAB — POST-VAS SPERM EVALUATION,QUAL: Volume: 1.2 mL

## 2017-09-14 ENCOUNTER — Telehealth: Payer: Self-pay | Admitting: Family Medicine

## 2017-09-14 NOTE — Telephone Encounter (Signed)
LMOM for patient to return call.

## 2017-09-14 NOTE — Telephone Encounter (Signed)
-----   Message from Vanna ScotlandAshley Brandon, MD sent at 09/13/2017  8:21 PM EDT ----- Sperm is still present!  At this point, this does not mean a failure of the vasectomy.  Like him to repeat this again in 1 month and in between then, ejaculate 10 more times.  If sperm continues to be present on repeat sample, he will need a formal semen analysis.  There is such thing is rare nonmotile sperm that can persist for quite some time.  This particular test only evaluates presence or absence of sperm no other motility or number.  Please continue to use alternative contraception for the next month until repeat study is performed.  Vanna ScotlandAshley Brandon, MD

## 2017-11-02 ENCOUNTER — Other Ambulatory Visit: Payer: BLUE CROSS/BLUE SHIELD

## 2017-11-02 DIAGNOSIS — Z9852 Vasectomy status: Secondary | ICD-10-CM

## 2017-11-04 ENCOUNTER — Telehealth: Payer: Self-pay | Admitting: Family Medicine

## 2017-11-04 LAB — POST-VAS SPERM EVALUATION,QUAL: Volume: 2 mL

## 2017-11-04 NOTE — Telephone Encounter (Signed)
Patient notified

## 2017-11-04 NOTE — Telephone Encounter (Signed)
-----   Message from Harle Battiest, PA-C sent at 11/04/2017  7:38 AM EDT ----- Please let Mr. Prell know that his post vas specimen was negative.
